# Patient Record
Sex: Male | Born: 1975
Health system: Southern US, Community
[De-identification: ages and names within clinical notes are randomized; demographics above are authoritative.]

---

## 1998-03-14 ENCOUNTER — Ambulatory Visit (HOSPITAL_COMMUNITY): Admission: RE | Admit: 1998-03-14 | Discharge: 1998-03-14 | Payer: Self-pay | Admitting: Family Medicine

## 1998-03-15 ENCOUNTER — Ambulatory Visit (HOSPITAL_COMMUNITY): Admission: RE | Admit: 1998-03-15 | Discharge: 1998-03-15 | Payer: Self-pay | Admitting: Family Medicine

## 2016-12-30 DIAGNOSIS — K580 Irritable bowel syndrome with diarrhea: Secondary | ICD-10-CM | POA: Diagnosis not present

## 2016-12-30 DIAGNOSIS — R7989 Other specified abnormal findings of blood chemistry: Secondary | ICD-10-CM | POA: Diagnosis not present

## 2016-12-30 DIAGNOSIS — Z79899 Other long term (current) drug therapy: Secondary | ICD-10-CM | POA: Diagnosis not present

## 2016-12-30 DIAGNOSIS — Z8042 Family history of malignant neoplasm of prostate: Secondary | ICD-10-CM | POA: Diagnosis not present

## 2017-01-06 DIAGNOSIS — R748 Abnormal levels of other serum enzymes: Secondary | ICD-10-CM | POA: Diagnosis not present

## 2017-05-01 DIAGNOSIS — M25561 Pain in right knee: Secondary | ICD-10-CM | POA: Diagnosis not present

## 2017-06-09 DIAGNOSIS — L814 Other melanin hyperpigmentation: Secondary | ICD-10-CM | POA: Diagnosis not present

## 2017-06-09 DIAGNOSIS — L923 Foreign body granuloma of the skin and subcutaneous tissue: Secondary | ICD-10-CM | POA: Diagnosis not present

## 2017-06-09 DIAGNOSIS — D485 Neoplasm of uncertain behavior of skin: Secondary | ICD-10-CM | POA: Diagnosis not present

## 2017-06-09 DIAGNOSIS — D1801 Hemangioma of skin and subcutaneous tissue: Secondary | ICD-10-CM | POA: Diagnosis not present

## 2017-06-09 DIAGNOSIS — D235 Other benign neoplasm of skin of trunk: Secondary | ICD-10-CM | POA: Diagnosis not present

## 2017-06-09 DIAGNOSIS — D225 Melanocytic nevi of trunk: Secondary | ICD-10-CM | POA: Diagnosis not present

## 2017-10-29 DIAGNOSIS — M222X1 Patellofemoral disorders, right knee: Secondary | ICD-10-CM | POA: Diagnosis not present

## 2017-10-29 DIAGNOSIS — M222X2 Patellofemoral disorders, left knee: Secondary | ICD-10-CM | POA: Diagnosis not present

## 2017-11-08 DIAGNOSIS — M222X2 Patellofemoral disorders, left knee: Secondary | ICD-10-CM | POA: Diagnosis not present

## 2017-11-08 DIAGNOSIS — M222X1 Patellofemoral disorders, right knee: Secondary | ICD-10-CM | POA: Diagnosis not present

## 2018-01-12 DIAGNOSIS — K580 Irritable bowel syndrome with diarrhea: Secondary | ICD-10-CM | POA: Diagnosis not present

## 2018-06-09 DIAGNOSIS — L821 Other seborrheic keratosis: Secondary | ICD-10-CM | POA: Diagnosis not present

## 2018-06-09 DIAGNOSIS — D485 Neoplasm of uncertain behavior of skin: Secondary | ICD-10-CM | POA: Diagnosis not present

## 2018-06-09 DIAGNOSIS — D2372 Other benign neoplasm of skin of left lower limb, including hip: Secondary | ICD-10-CM | POA: Diagnosis not present

## 2018-06-09 DIAGNOSIS — L720 Epidermal cyst: Secondary | ICD-10-CM | POA: Diagnosis not present

## 2018-06-09 DIAGNOSIS — L570 Actinic keratosis: Secondary | ICD-10-CM | POA: Diagnosis not present

## 2018-06-09 DIAGNOSIS — D2261 Melanocytic nevi of right upper limb, including shoulder: Secondary | ICD-10-CM | POA: Diagnosis not present

## 2018-06-30 DIAGNOSIS — H00025 Hordeolum internum left lower eyelid: Secondary | ICD-10-CM | POA: Diagnosis not present

## 2018-08-05 DIAGNOSIS — R490 Dysphonia: Secondary | ICD-10-CM | POA: Diagnosis not present

## 2019-06-11 ENCOUNTER — Emergency Department (HOSPITAL_COMMUNITY): Payer: BC Managed Care – PPO

## 2019-06-11 ENCOUNTER — Other Ambulatory Visit: Payer: Self-pay

## 2019-06-11 ENCOUNTER — Emergency Department (HOSPITAL_COMMUNITY)
Admission: EM | Admit: 2019-06-11 | Discharge: 2019-06-11 | Disposition: A | Discharge status: Home / Self Care | Payer: BC Managed Care – PPO | Attending: Emergency Medicine | Admitting: Emergency Medicine

## 2019-06-11 ENCOUNTER — Encounter (HOSPITAL_COMMUNITY): Payer: Self-pay | Admitting: *Deleted

## 2019-06-11 DIAGNOSIS — R1013 Epigastric pain: Secondary | ICD-10-CM | POA: Insufficient documentation

## 2019-06-11 DIAGNOSIS — R112 Nausea with vomiting, unspecified: Secondary | ICD-10-CM | POA: Insufficient documentation

## 2019-06-11 DIAGNOSIS — K5730 Diverticulosis of large intestine without perforation or abscess without bleeding: Secondary | ICD-10-CM | POA: Diagnosis not present

## 2019-06-11 DIAGNOSIS — R1011 Right upper quadrant pain: Secondary | ICD-10-CM

## 2019-06-11 DIAGNOSIS — K7689 Other specified diseases of liver: Secondary | ICD-10-CM | POA: Diagnosis not present

## 2019-06-11 LAB — LIPASE, BLOOD: Lipase: 31 U/L (ref 11–51)

## 2019-06-11 LAB — URINALYSIS, ROUTINE W REFLEX MICROSCOPIC
Bacteria, UA: NONE SEEN
Bilirubin Urine: NEGATIVE
Glucose, UA: NEGATIVE mg/dL
Hgb urine dipstick: NEGATIVE
Ketones, ur: NEGATIVE mg/dL
Leukocytes,Ua: NEGATIVE
Nitrite: NEGATIVE
Protein, ur: 30 mg/dL — AB
Specific Gravity, Urine: 1.027 (ref 1.005–1.030)
pH: 7 (ref 5.0–8.0)

## 2019-06-11 LAB — CBC
HCT: 43.7 % (ref 39.0–52.0)
Hemoglobin: 14.7 g/dL (ref 13.0–17.0)
MCH: 29.7 pg (ref 26.0–34.0)
MCHC: 33.6 g/dL (ref 30.0–36.0)
MCV: 88.3 fL (ref 80.0–100.0)
Platelets: 163 10*3/uL (ref 150–400)
RBC: 4.95 MIL/uL (ref 4.22–5.81)
RDW: 12.2 % (ref 11.5–15.5)
WBC: 7.3 10*3/uL (ref 4.0–10.5)
nRBC: 0 % (ref 0.0–0.2)

## 2019-06-11 LAB — COMPREHENSIVE METABOLIC PANEL
ALT: 137 U/L — ABNORMAL HIGH (ref 0–44)
AST: 153 U/L — ABNORMAL HIGH (ref 15–41)
Albumin: 4.2 g/dL (ref 3.5–5.0)
Alkaline Phosphatase: 94 U/L (ref 38–126)
Anion gap: 10 (ref 5–15)
BUN: 12 mg/dL (ref 6–20)
CO2: 29 mmol/L (ref 22–32)
Calcium: 9.2 mg/dL (ref 8.9–10.3)
Chloride: 103 mmol/L (ref 98–111)
Creatinine, Ser: 1 mg/dL (ref 0.61–1.24)
GFR calc Af Amer: >60 mL/min (ref >60)
GFR calc non Af Amer: >60 mL/min (ref >60)
Glucose, Bld: 162 mg/dL — ABNORMAL HIGH (ref 70–99)
Potassium: 3.8 mmol/L (ref 3.5–5.1)
Sodium: 142 mmol/L (ref 135–145)
Total Bilirubin: 1.1 mg/dL (ref 0.3–1.2)
Total Protein: 6.5 g/dL (ref 6.5–8.1)

## 2019-06-11 MED ORDER — ONDANSETRON 4 MG PO TBDP: 4.0000 mg | ORAL_TABLET | Freq: Three times a day (TID) | ORAL | 0 refills | Status: AC | PRN

## 2019-06-11 MED ORDER — MORPHINE SULFATE (PF) 4 MG/ML IV SOLN
4.0000 mg | Freq: Once | INTRAVENOUS | Status: AC
  Administered 2019-06-11: 4 mg via INTRAVENOUS
  Filled 2019-06-11: qty 1

## 2019-06-11 MED ORDER — IOHEXOL 300 MG/ML  SOLN
100.0000 mL | Freq: Once | INTRAMUSCULAR | Status: AC | PRN
  Administered 2019-06-11: 100 mL via INTRAVENOUS

## 2019-06-11 MED ORDER — ONDANSETRON HCL 4 MG/2ML IJ SOLN
4.0000 mg | Freq: Once | INTRAMUSCULAR | Status: AC
  Administered 2019-06-11: 06:00:00 4 mg via INTRAVENOUS
  Filled 2019-06-11: qty 2

## 2019-06-11 MED ORDER — SODIUM CHLORIDE 0.9% FLUSH
3.0000 mL | Freq: Once | INTRAVENOUS | Status: AC
  Administered 2019-06-11: 3 mL via INTRAVENOUS

## 2019-06-11 MED ORDER — SODIUM CHLORIDE 0.9 % IV BOLUS
1000.0000 mL | Freq: Once | INTRAVENOUS | Status: AC
  Administered 2019-06-11: 1000 mL via INTRAVENOUS

## 2019-06-11 NOTE — ED Provider Notes (Signed)
[facility] EMERGENCY DEPARTMENT Provider Note   CSN: [id number] Arrival date & time: 06/11/19  0000     History   Chief Complaint Chief Complaint  Patient presents with  . Abdominal Cramping    HPI Jonathan Norris is a 43 y.o. male who is previously healthy who presents with abdominal pain that began after eating hotdogs tonight.  He has had associated nausea and vomiting.  He describes his pain as a combination of sharp, cramping, dull.  He reports this started in his epigastrium and has spread everywhere.  He denies any fevers, diarrhea, bloody stools, urinary symptoms.  Patient reports he has back pain most the time and is unsure if he is having any back pain related to this.  Patient notes that his brother recently had his gallbladder out and his father has also had his gallbladder out.     HPI  History reviewed. No pertinent past medical history.  There are no active problems to display for this patient.   History reviewed. No pertinent surgical history.      Home Medications    Prior to Admission medications   Not on File    Family History No family history on file.  Social History Social History   Tobacco Use  . Smoking status: Never Smoker  . Smokeless tobacco: Never Used  Substance Use Topics  . Alcohol use: Never    Frequency: Never  . Drug use: Not on file     Allergies   Patient has no known allergies.   Review of Systems Review of Systems  Constitutional: Negative for chills and fever.  HENT: Negative for facial swelling and sore throat.   Respiratory: Negative for shortness of breath.   Cardiovascular: Negative for chest pain.  Gastrointestinal: Positive for abdominal pain, nausea and vomiting. Negative for blood in stool and diarrhea.  Genitourinary: Negative for dysuria.  Musculoskeletal: Negative for back pain.  Skin: Negative for rash and wound.  Neurological: Negative for headaches.  Psychiatric/Behavioral:  The patient is not nervous/anxious.      Physical Exam Updated Vital Signs BP 137/84 (BP Location: Right Arm)   Pulse 86   Temp 98 F (36.7 C) (Oral)   Resp 18   Ht 6\' 2"  (1.88 m)   Wt 86.2 kg   SpO2 96%   BMI 24.39 kg/m   Physical Exam Vitals signs and nursing note reviewed.  Constitutional:      General: He is not in acute distress.    Appearance: He is well-developed. He is not diaphoretic.  HENT:     Head: Normocephalic and atraumatic.     Mouth/Throat:     Pharynx: No oropharyngeal exudate.  Eyes:     General: No scleral icterus.       Right eye: No discharge.        Left eye: No discharge.     Conjunctiva/sclera: Conjunctivae normal.     Pupils: Pupils are equal, round, and reactive to light.  Neck:     Musculoskeletal: Normal range of motion and neck supple.     Thyroid: No thyromegaly.  Cardiovascular:     Rate and Rhythm: Normal rate and regular rhythm.     Heart sounds: Normal heart sounds. No murmur. No friction rub. No gallop.   Pulmonary:     Effort: Pulmonary effort is normal. No respiratory distress.     Breath sounds: Normal breath sounds. No stridor. No wheezing or rales.  Abdominal:  General: Bowel sounds are normal. There is no distension.     Palpations: Abdomen is soft.     Tenderness: There is generalized abdominal tenderness and tenderness in the right upper quadrant, epigastric area, left upper quadrant and left lower quadrant. There is no right CVA tenderness, left CVA tenderness, guarding or rebound. Positive signs include Murphy's sign. Negative signs include McBurney's sign.     Comments: Splinting  Lymphadenopathy:     Cervical: No cervical adenopathy.  Skin:    General: Skin is warm and dry.     Coloration: Skin is not pale.     Findings: No rash.  Neurological:     Mental Status: He is alert.     Coordination: Coordination normal.      ED Treatments / Results  Labs (all labs ordered are listed, but only abnormal results  are displayed) Labs Reviewed  COMPREHENSIVE METABOLIC PANEL - Abnormal; Notable for the following components:      Result Value   Glucose, Bld 162 (*)    AST 153 (*)    ALT 137 (*)    All other components within normal limits  URINALYSIS, ROUTINE W REFLEX MICROSCOPIC - Abnormal; Notable for the following components:   Protein, ur 30 (*)    All other components within normal limits  LIPASE, BLOOD  CBC    EKG None  Radiology No results found.  Procedures Procedures (including critical care time)  Medications Ordered in ED Medications  sodium chloride flush (NS) 0.9 % injection 3 mL (has no administration in time range)  sodium chloride 0.9 % bolus 1,000 mL (1,000 mLs Intravenous New Bag/Given 06/11/19 0620)  ondansetron (ZOFRAN) injection 4 mg (4 mg Intravenous Given 06/11/19 0621)  morphine 4 MG/ML injection 4 mg (4 mg Intravenous Given 06/11/19 5681)     Initial Impression / Assessment and Plan / ED Course  I have reviewed the triage vital signs and the nursing notes.  Pertinent labs & imaging results that were available during my care of the patient were reviewed by me and considered in my medical decision making (see chart for details).         Patient presenting with epigastric, right upper quadrant, and left lower quadrant pain.  LFTs are elevated.  No leukocytosis.  Will start with CT abdomen pelvis considering more generalized pain.  Pain controlled with morphine.  Will start IV fluids. At shift change, patient care transferred to Ferne Reus, PA-C for continued evaluation, follow up of CTAP and determination of disposition.     Final Clinical Impressions(s) / ED Diagnoses   Final diagnoses:  Abdominal pain, unspecified abdominal location    ED Discharge Orders    None       Frederica Kuster, PA-C 06/11/19 [place], [place], DO 06/11/19 [phone]

## 2019-06-11 NOTE — ED Provider Notes (Signed)
Epigastric pain after eating hot dogs, then pain traveled to lower abdomen. Tender in LLQ No fever, +N, V No history of gall stones.   Patient care signed out to me by Jonathan Peaks, Jonathan Norris, pending CT abd/pel given distribution of abdominal pain. LFT elevated, post-prandial sxs. May need RUQ [place] (elevated LFT's) if CT negative.   CT results are negative for acute finding. [place] ordered. Patient's pain still well controlled. Tender RUQ>epigastrium. VSS.   US shows gall stone vs polyp. No cholecystitis. He can be discharged home. Will provide referral to surgery for elective follow up.   Jonathan Lange, Jonathan Norris 06/11/19 1029    Jonathan Essex, MD 06/11/19 1654

## 2019-06-11 NOTE — ED Triage Notes (Signed)
The pt is c/o abd with n and vomiting  No diarrhea  His pain started after he ate hot dogs earlier tonight the pain is increasing

## 2019-06-11 NOTE — Discharge Instructions (Signed)
Follow up with [facility] electively for consideration of gall bladder removal.   If you develop a fever, severe pain, uncontrolled vomiting or new concern, please return to the emergency department immediately.

## 2019-06-12 ENCOUNTER — Other Ambulatory Visit: Payer: Self-pay

## 2019-06-12 ENCOUNTER — Emergency Department (HOSPITAL_COMMUNITY)
Admission: EM | Admit: 2019-06-12 | Discharge: 2019-06-12 | Disposition: A | Discharge status: Home / Self Care | Payer: BC Managed Care – PPO | Attending: Emergency Medicine | Admitting: Emergency Medicine

## 2019-06-12 DIAGNOSIS — R1013 Epigastric pain: Secondary | ICD-10-CM | POA: Diagnosis not present

## 2019-06-12 DIAGNOSIS — K8050 Calculus of bile duct without cholangitis or cholecystitis without obstruction: Secondary | ICD-10-CM | POA: Diagnosis not present

## 2019-06-12 LAB — COMPREHENSIVE METABOLIC PANEL
ALT: 98 U/L — ABNORMAL HIGH (ref 0–44)
AST: 45 U/L — ABNORMAL HIGH (ref 15–41)
Albumin: 4.2 g/dL (ref 3.5–5.0)
Alkaline Phosphatase: 88 U/L (ref 38–126)
Anion gap: 12 (ref 5–15)
BUN: 10 mg/dL (ref 6–20)
CO2: 28 mmol/L (ref 22–32)
Calcium: 9.4 mg/dL (ref 8.9–10.3)
Chloride: 103 mmol/L (ref 98–111)
Creatinine, Ser: 1.08 mg/dL (ref 0.61–1.24)
GFR calc Af Amer: >60 mL/min (ref >60)
GFR calc non Af Amer: >60 mL/min (ref >60)
Glucose, Bld: 107 mg/dL — ABNORMAL HIGH (ref 70–99)
Potassium: 4 mmol/L (ref 3.5–5.1)
Sodium: 143 mmol/L (ref 135–145)
Total Bilirubin: 2.3 mg/dL — ABNORMAL HIGH (ref 0.3–1.2)
Total Protein: 6.8 g/dL (ref 6.5–8.1)

## 2019-06-12 LAB — CBC
HCT: 45.1 % (ref 39.0–52.0)
Hemoglobin: 15.4 g/dL (ref 13.0–17.0)
MCH: 30 pg (ref 26.0–34.0)
MCHC: 34.1 g/dL (ref 30.0–36.0)
MCV: 87.9 fL (ref 80.0–100.0)
Platelets: 156 10*3/uL (ref 150–400)
RBC: 5.13 MIL/uL (ref 4.22–5.81)
RDW: 12.2 % (ref 11.5–15.5)
WBC: 6.4 10*3/uL (ref 4.0–10.5)
nRBC: 0 % (ref 0.0–0.2)

## 2019-06-12 LAB — LIPASE, BLOOD: Lipase: 37 U/L (ref 11–51)

## 2019-06-12 MED ORDER — HYDROCODONE-ACETAMINOPHEN 5-325 MG PO TABS: 1.0000 | ORAL_TABLET | Freq: Four times a day (QID) | ORAL | 0 refills | Status: AC | PRN

## 2019-06-12 NOTE — ED Triage Notes (Signed)
Pt endorses gallbladder attack, was d/c home yesterday after being diagnosed with a gallstone. Told to come back if it got worse again. VSS.

## 2019-06-12 NOTE — ED Provider Notes (Signed)
[facility] EMERGENCY DEPARTMENT Provider Note   CSN: [id number] Arrival date & time: 06/12/19  2355     History   Chief Complaint Chief Complaint  Patient presents with   Abdominal Pain    HPI Jonathan Norris is a 43 y.o. male.     Patient seen yesterday.  Patient had CT scan and then ultrasound of the abdomen consistent with biliary sludge.  Common bile duct was 5 mm.  Patient with recurrent pain this morning at about 8 with a eating breakfast.  Lasted for 2 hours and now has resolved.  Patient currently asymptomatic.  The pain he had was mostly epigastric area.  Yesterday patient had nausea and vomiting.     No past medical history on file.  There are no active problems to display for this patient.   No past surgical history on file.      Home Medications    Prior to Admission medications   Medication Sig Start Date End Date Taking? Authorizing Provider  ondansetron (ZOFRAN ODT) 4 MG disintegrating tablet Take 1 tablet (4 mg total) by mouth every 8 (eight) hours as needed for nausea or vomiting. 06/11/19   Charlann Lange, PA-C    Family History No family history on file.  Social History Social History   Tobacco Use   Smoking status: Never Smoker   Smokeless tobacco: Never Used  Substance Use Topics   Alcohol use: Never    Frequency: Never   Drug use: Not on file     Allergies   Patient has no known allergies.   Review of Systems Review of Systems  Constitutional: Negative for chills and fever.  HENT: Negative for congestion, rhinorrhea and sore throat.   Eyes: Negative for visual disturbance.  Respiratory: Negative for cough and shortness of breath.   Cardiovascular: Negative for chest pain and leg swelling.  Gastrointestinal: Positive for abdominal pain. Negative for diarrhea, nausea and vomiting.  Genitourinary: Negative for dysuria.  Musculoskeletal: Negative for back pain and neck pain.  Skin: Negative for rash.    Neurological: Negative for dizziness, light-headedness and headaches.  Hematological: Does not bruise/bleed easily.  Psychiatric/Behavioral: Negative for confusion.     Physical Exam Updated Vital Signs BP (!) 156/95 (BP Location: Right Arm)    Pulse 85    Temp 97.8 F (36.6 C) (Oral)    Resp 20    SpO2 99%   Physical Exam Vitals signs and nursing note reviewed.  Constitutional:      Appearance: Normal appearance. He is well-developed.  HENT:     Head: Normocephalic and atraumatic.  Eyes:     Conjunctiva/sclera: Conjunctivae normal.  Neck:     Musculoskeletal: Normal range of motion and neck supple.  Cardiovascular:     Rate and Rhythm: Normal rate and regular rhythm.     Heart sounds: No murmur.  Pulmonary:     Effort: Pulmonary effort is normal. No respiratory distress.     Breath sounds: Normal breath sounds.  Abdominal:     General: There is no distension.     Palpations: Abdomen is soft. There is no mass.     Tenderness: There is no abdominal tenderness. There is no guarding.  Skin:    General: Skin is warm and dry.     Capillary Refill: Capillary refill takes less than 2 seconds.  Neurological:     General: No focal deficit present.     Mental Status: He is alert and oriented to person,  place, and time.      ED Treatments / Results  Labs (all labs ordered are listed, but only abnormal results are displayed) Labs Reviewed  COMPREHENSIVE METABOLIC PANEL - Abnormal; Notable for the following components:      Result Value   Glucose, Bld 107 (*)    AST 45 (*)    ALT 98 (*)    Total Bilirubin 2.3 (*)    All other components within normal limits  LIPASE, BLOOD  CBC    EKG None  Radiology Ct Abdomen Pelvis W Contrast  Result Date: 06/11/2019 CLINICAL DATA:  Diffuse abdominal pain, worse in the right upper quadrant, epigastric region and left lower quadrant. Nausea and vomiting. Symptoms began after eating hot dog yesterday. EXAM: CT ABDOMEN AND PELVIS  WITH CONTRAST TECHNIQUE: Multidetector CT imaging of the abdomen and pelvis was performed using the standard protocol following bolus administration of intravenous contrast. CONTRAST:  175m OMNIPAQUE IOHEXOL 300 MG/ML  SOLN COMPARISON:  None. FINDINGS: Lower chest: Unremarkable. Hepatobiliary: No focal liver abnormality is seen. No gallstones, gallbladder wall thickening, or biliary dilatation. Pancreas: Unremarkable. No pancreatic ductal dilatation or surrounding inflammatory changes. Spleen: Normal in size without focal abnormality. Adrenals/Urinary Tract: Oval dense calcification at the inferior aspect of the left adrenal gland, possibly related to previous hemorrhage. Otherwise, the adrenal glands have normal appearances. Several small right renal probable cysts. Normal appearing left kidney, ureters and urinary bladder. Stomach/Bowel: Single small sigmoid colon diverticula without evidence of diverticulitis. Otherwise, normal appearing colon, appendix, small bowel and stomach. Vascular/Lymphatic: No significant vascular findings are present. No enlarged abdominal or pelvic lymph nodes. Reproductive: Prostate is unremarkable. Other: Tiny umbilical hernia containing fat. Musculoskeletal: Small left iliac bone island. Mild lumbar and lower thoracic spine degenerative changes. IMPRESSION: No acute abnormality. Electronically Signed   By: SClaudie ReveringM.D.   On: 06/11/2019 08:12   [place]dom[place] Limited  Result Date: 06/11/2019 CLINICAL DATA:  Right upper quadrant pain EXAM: ULTRASOUND ABDOMEN LIMITED RIGHT UPPER QUADRANT COMPARISON:  CT of the abdomen and pelvis dated 06/11/2019 FINDINGS: Gallbladder: 5 x 3 x 3 mm area slightly increased echogenicity without definitive posterior acoustic shadowing in the dependent gallbladder. No signs of wall thickening or reported sonographic Murphy's sign. Common bile duct: Diameter: 5 mm Liver: No focal lesion identified. Within normal limits in parenchymal echogenicity.  Portal vein is patent on color Doppler imaging with normal direction of blood flow towards the liver. Other: None. IMPRESSION: Small stone or sludge in the dependent gallbladder. Given non mobile appearance small polyp is included in the differential. No ultrasound signs of acute cholecystitis. Common bile duct at upper limits of normal. Electronically Signed   By: GZetta BillsM.D.   On: 06/11/2019 10:16    Procedures Procedures (including critical care time)  Medications Ordered in ED Medications - No data to display   Initial Impression / Assessment and Plan / ED Course  I have reviewed the triage vital signs and the nursing notes.  Pertinent labs & imaging results that were available during my care of the patient were reviewed by me and considered in my medical decision making (see chart for details).       Patient with ultrasound done yesterday highly suggestive of gallbladder sludge.  Symptoms probably related to cholelithiasis.  Patient recurrent pain this morning that lasted for 2 hours now completely resolved abdomen nontender.  Labs were already drawn we will compare labs to yesterday.  [facility]  set up elective [facility]bladder removal.  Patient currently nontoxic no acute distress.  Labs without significant abnormalities.  No leukocytosis.  Lipase is normal.  Today bilirubin is elevated at 2.2.  Alk phos is normal.  Patient completely nontender now no further symptoms.  Patient stable for discharge home and follow-up with general surgery.  Final Clinical Impressions(s) / ED Diagnoses   Final diagnoses:  Biliary colic    ED Discharge Orders    None       Fredia Sorrow, MD 06/12/19 1121

## 2019-06-12 NOTE — Discharge Instructions (Signed)
Stick with a bland diet as best as you can.  Return for any new or worse symptoms.  Return for fevers persistent vomiting abdominal pain in the upper part of the abdomen and that the last 3 hours or longer.  Take the pain medicine as needed.  Follow-up with [facility] surgery.

## 2019-06-13 ENCOUNTER — Ambulatory Visit: Payer: Self-pay | Admitting: Surgery

## 2019-06-13 DIAGNOSIS — K580 Irritable bowel syndrome with diarrhea: Secondary | ICD-10-CM | POA: Diagnosis not present

## 2019-06-13 DIAGNOSIS — K8010 Calculus of gallbladder with chronic cholecystitis without obstruction: Secondary | ICD-10-CM | POA: Diagnosis not present

## 2019-06-13 NOTE — H&P (Signed)
Jonathan Norris Documented: 06/13/2019 9:14 AM Location: [place] [facility] Patient #: [id number] DOB: January 14, 1976 Married / Language: [place] / Race: White Male  History of Present Illness Ardeth Sportsman(Suzan Manon C. Maralyn Witherell MD; 06/13/2019 9:48 AM) The patient is a 43 year old male who presents for evaluation of gall stones. Note for "Gall stones": ` ` ` Patient sent for surgical consultation at the request of Dutch QuintScott Zakowski, MD, [facility]  Chief Complaint: abdominal pain. Probable biliary colic ` ` The patient is a pleasant healthy active male. He had an episode of severe epigastric pain after eating 2 hot dogs last weekend. He tried Tums and over-the-counter medicines without help. Felt nauseated and threw up. Would not relent. Went to the emergency room. Hopefully had morphine to help calm things down. Pain was not focal coronary emergent department. Patient notes the pain was primarily epigastric. CT scan was done which was rather underwhelming. Ultrasound done which showed something in the gallbladder wall most likely a stone with some sludge. Information need to consider outpatient surgical follow-up since his pain subsided after 1 dose of morphine. He then had pain the following day after having some eggs and on but her to toast. Pain was even more intense. Went back to the emergency room. Bilirubin 2.2. Rest of his labs are normal. Pain faded away after a few hours. No evidence of cholecystitis. Surgical follow-up recommended. He comes in today, 2 days later.  Patient denies any issues with heartburn or reflux or any other abdominal issues. He does have some occasional loose bowel movements. He's [facility]. He recalls getting a colonoscopy that was underwhelming. Records are not available. He shocks at up to a sensitive stomach. Initially wondered if this was food poisoning but known else was sick. And he had a second attack which made him  more concerned. His urine has been a little more darker yellow but no black urine. No Orange or jaundice, wearing. He is otherwise rather physically active. He rarely drinks alcohol and has not had any past several weeks. He does not smoke. He is not diabetic.  No personal nor family history of GI/colon cancer, inflammatory bowel disease, irritable bowel syndrome, allergy such as Celiac Sprue, dietary/dairy problems, colitis, ulcers nor gastritis. No recent sick contacts/gastroenteritis. No travel outside the country. No changes in diet. No dysphagia to solids or liquids. No significant heartburn or reflux. No hematochezia, hematemesis, coffee ground emesis. No evidence of prior gastric/peptic ulceration.  (Review of systems as stated in this history (HPI) or in the review of systems. Otherwise all other 12 point ROS are negative) ` ` `   Past Surgical History Ethlyn Gallery(Alisha Spillers, CMA; 06/13/2019 9:15 AM) Oral Surgery  Diagnostic Studies History Elease Hashimoto(Alisha Spillers, CMA; 06/13/2019 9:15 AM) Colonoscopy 1-5 years ago  Allergies Elease Hashimoto(Alisha Spillers, CMA; 06/13/2019 9:16 AM) No Known Drug Allergies [06/13/2019]:  Medication History (Alisha Spillers, CMA; 06/13/2019 9:16 AM) No Current Medications Medications Reconciled  Social History Ethlyn Gallery(Alisha Spillers, CMA; 06/13/2019 9:15 AM) Alcohol use Occasional alcohol use. Caffeine use Carbonated beverages, Coffee. No drug use Tobacco use Never smoker.  Family History Ethlyn Gallery(Alisha Spillers, CMA; 06/13/2019 9:15 AM) Diabetes Mellitus Father. Heart Disease Father.  Other Problems Ethlyn Gallery(Alisha Spillers, CMA; 06/13/2019 9:15 AM) Back Pain     Review of Systems (Alisha Spillers CMA; 06/13/2019 9:15 AM) General Not Present- Appetite Loss, Chills, Fatigue, Fever, Night Sweats, Weight Gain and Weight Loss. Skin Not Present- Change in Wart/Mole, Dryness, Hives, Jaundice, New Lesions, Non-Healing Wounds, Rash  and Ulcer. HEENT Not Present-  Earache, Hearing Loss, Hoarseness, Nose Bleed, Oral Ulcers, Ringing in the Ears, Seasonal Allergies, Sinus Pain, Sore Throat, Visual Disturbances, Wears glasses/contact lenses and Yellow Eyes. Respiratory Not Present- Bloody sputum, Chronic Cough, Difficulty Breathing, Snoring and Wheezing. Breast Not Present- Breast Mass, Breast Pain, Nipple Discharge and Skin Changes. Cardiovascular Not Present- Chest Pain, Difficulty Breathing Lying Down, Leg Cramps, Palpitations, Rapid Heart Rate, Shortness of Breath and Swelling of Extremities. Gastrointestinal Present- Abdominal Pain, Nausea and Vomiting. Not Present- Bloating, Bloody Stool, Change in Bowel Habits, Chronic diarrhea, Constipation, Difficulty Swallowing, Excessive gas, Gets full quickly at meals, Hemorrhoids, Indigestion and Rectal Pain. Male Genitourinary Not Present- Blood in Urine, Change in Urinary Stream, Frequency, Impotence, Nocturia, Painful Urination, Urgency and Urine Leakage. Musculoskeletal Not Present- Back Pain, Joint Pain, Joint Stiffness, Muscle Pain, Muscle Weakness and Swelling of Extremities. Neurological Not Present- Decreased Memory, Fainting, Headaches, Numbness, Seizures, Tingling, Tremor, Trouble walking and Weakness. Psychiatric Not Present- Anxiety, Bipolar, Change in Sleep Pattern, Depression, Fearful and Frequent crying. Endocrine Not Present- Cold Intolerance, Excessive Hunger, Hair Changes, Heat Intolerance, Hot flashes and New Diabetes. Hematology Not Present- Blood Thinners, Easy Bruising, Excessive bleeding, Gland problems, HIV and Persistent Infections.  Vitals (Alisha Spillers CMA; 06/13/2019 9:16 AM) 06/13/2019 9:15 AM Weight: 186 lb Height: 74in Body Surface Area: 2.11 m Body Mass Index: 23.88 kg/m  Temp.: 98.86F(Oral)  Pulse: 86 (Regular)  BP: 110/74 (Sitting, Left Arm, Standard)        Physical Exam Adin Hector MD; 06/13/2019 9:43 AM)  General Mental Status-Alert. General  Appearance-Not in acute distress, Not Sickly. Orientation-Oriented X3. Hydration-Well hydrated. Voice-Normal.  Integumentary Global Assessment Upon inspection and palpation of skin surfaces of the - Axillae: non-tender, no inflammation or ulceration, no drainage. and Distribution of scalp and body hair is normal. General Characteristics Temperature - normal warmth is noted.  Head and Neck Head-normocephalic, atraumatic with no lesions or palpable masses. Face Global Assessment - atraumatic, no absence of expression. Neck Global Assessment - no abnormal movements, no bruit auscultated on the right, no bruit auscultated on the left, no decreased range of motion, non-tender. Trachea-midline. Thyroid Gland Characteristics - non-tender.  Eye Eyeball - Left-Extraocular movements intact, No Nystagmus - Left. Eyeball - Right-Extraocular movements intact, No Nystagmus - Right. Cornea - Left-No Hazy - Left. Cornea - Right-No Hazy - Right. Sclera/Conjunctiva - Left-No scleral icterus, No Discharge - Left. Sclera/Conjunctiva - Right-No scleral icterus, No Discharge - Right. Pupil - Left-Direct reaction to light normal. Pupil - Right-Direct reaction to light normal.  ENMT Ears Pinna - Left - no drainage observed, no generalized tenderness observed. Pinna - Right - no drainage observed, no generalized tenderness observed. Nose and Sinuses External Inspection of the Nose - no destructive lesion observed. Inspection of the nares - Left - quiet respiration. Inspection of the nares - Right - quiet respiration. Mouth and Throat Lips - Upper Lip - no fissures observed, no pallor noted. Lower Lip - no fissures observed, no pallor noted. Nasopharynx - no discharge present. Oral Cavity/Oropharynx - Tongue - no dryness observed. Oral Mucosa - no cyanosis observed. Hypopharynx - no evidence of airway distress observed.  Chest and Lung Exam Inspection Movements - Normal and  Symmetrical. Accessory muscles - No use of accessory muscles in breathing. Palpation Palpation of the chest reveals - Non-tender. Auscultation Breath sounds - Normal and Clear.  Cardiovascular Auscultation Rhythm - Regular. Murmurs & Other Heart Sounds - Auscultation of the heart reveals - No Murmurs and  No Systolic Clicks.  Abdomen Inspection Inspection of the abdomen reveals - No Visible peristalsis and No Abnormal pulsations. Umbilicus - No Bleeding, No Urine drainage. Palpation/Percussion Palpation and Percussion of the abdomen reveal - Soft, Non Tender, No Rebound tenderness, No Rigidity (guarding) and No Cutaneous hyperesthesia. Note: Abdomen soft. mild epigastric and right upper quadrant discomfort. Less so on the left side. Periumbilical and infraumbilical abdomen nontender. Not distended. No umbilical or incisional hernias. No guarding.  Male Genitourinary Sexual Maturity Tanner 5 - Adult hair pattern and Adult penile size and shape.  Peripheral Vascular Upper Extremity Inspection - Left - No Cyanotic nailbeds - Left, Not Ischemic. Inspection - Right - No Cyanotic nailbeds - Right, Not Ischemic.  Neurologic Neurologic evaluation reveals -normal attention span and ability to concentrate, able to name objects and repeat phrases. Appropriate fund of knowledge , normal sensation and normal coordination. Mental Status Affect - not angry, not paranoid. Cranial Nerves-Normal Bilaterally. Gait-Normal.  Neuropsychiatric Mental status exam performed with findings of-able to articulate well with normal speech/language, rate, volume and coherence, thought content normal with ability to perform basic computations and apply abstract reasoning and no evidence of hallucinations, delusions, obsessions or homicidal/suicidal ideation.  Musculoskeletal Global Assessment Spine, Ribs and Pelvis - no instability, subluxation or laxity. Right Upper Extremity - no instability,  subluxation or laxity.  Lymphatic Head & Neck  General Head & Neck Lymphatics: Bilateral - Description - No Localized lymphadenopathy. Axillary  General Axillary Region: Bilateral - Description - No Localized lymphadenopathy. Femoral & Inguinal  Generalized Femoral & Inguinal Lymphatics: Left - Description - No Localized lymphadenopathy. Right - Description - No Localized lymphadenopathy.    Assessment & Plan Ardeth Sportsman MD; 06/13/2019 9:45 AM)  HYPERBILIRUBINEMIA (E80.6) Impression: bilirubin elevated to 2.2 yesterday. He is feeling better now and does not have any obvious jaundice. I'm skeptical of obstructing choledocholithiasis. Certainly we will do cholangiogram. Again would like to try and do this soon and not wait a few months. Patient would like to get surgery as soon as possible should there be evidence of a common bile duct stone, will recheck to his gastrologist, Dr. Dulce Sellar, who could do ERCP.   CHRONIC CHOLECYSTITIS WITH CALCULUS (K80.10) Impression: classic story of biliary colic with differential diagnosis otherwise underwhelming.  I think he would benefit from cholecystectomy. With him going to the emergency room twice in the past week, I don't think this can wait very long. He would like to get it done as soon as possible. We will work to coordinate a convenient time  Current Plans You are being scheduled for surgery- Our schedulers will call you.  You should hear from our office's scheduling department within 5 working days about the location, date, and time of surgery. We try to make accommodations for patient's preferences in scheduling surgery, but sometimes the OR schedule or the surgeon's schedule prevents [place] from making those accommodations.  If you have not heard from our office [phone]) in 5 working days, call the office and ask for your surgeon's nurse.  If you have other questions about your diagnosis, plan, or surgery, call the office and ask  for your surgeon's nurse.  Written instructions provided Pt Education - Pamphlet Given - Laparoscopic Gallbladder Surgery: discussed with patient and provided information. The anatomy & physiology of hepatobiliary & pancreatic function was discussed. The pathophysiology of gallbladder dysfunction was discussed. Natural history risks without surgery was discussed. I feel the risks of no intervention will lead to serious problems that  outweigh the operative risks; therefore, I recommended cholecystectomy to remove the pathology. I explained laparoscopic techniques with possible need for an open approach. Probable cholangiogram to evaluate the bilary tract was explained as well.  Risks such as bleeding, infection, abscess, leak, injury to other organs, need for further treatment, heart attack, death, and other risks were discussed. I noted a good likelihood this will help address the problem. Possibility that this will not correct all abdominal symptoms was explained. Goals of post-operative recovery were discussed as well. We will work to minimize complications. An educational handout further explaining the pathology and treatment options was given as well. Questions were answered. The patient expresses understanding & wishes to proceed with surgery.  Pt Education - [facility] Laparosopic Post Op HCI (Lissie Hinesley) Pt Education - [facility] Good Bowel Health (Aalayah Riles) Pt Education - Laparoscopic Cholecystectomy: gallbladder  IRRITABLE BOWEL SYNDROME WITH DIARRHEA (K58.0) Impression: He claims to have loose bowel movements. Does not seem to be high frequency/volume. He seen [facility] gastroenterology. Dr. Dulce Sellar. No major issues recently.  Current Plans Pt Education - CCS IBS patient info: discussed with patient and provided information. Pt Education - [facility] Good Bowel Health (Barnaby Rippeon)  Ardeth Sportsman, MD, FACS, MASCRS Gastrointestinal and Minimally Invasive Surgery  [facility] [address]  [address] [place], [place] [address] [phone] Main / Paging [phone] Fax

## 2019-06-19 DIAGNOSIS — D2261 Melanocytic nevi of right upper limb, including shoulder: Secondary | ICD-10-CM | POA: Diagnosis not present

## 2019-06-19 DIAGNOSIS — D225 Melanocytic nevi of trunk: Secondary | ICD-10-CM | POA: Diagnosis not present

## 2019-06-19 DIAGNOSIS — L570 Actinic keratosis: Secondary | ICD-10-CM | POA: Diagnosis not present

## 2019-06-19 DIAGNOSIS — L814 Other melanin hyperpigmentation: Secondary | ICD-10-CM | POA: Diagnosis not present

## 2019-07-03 DIAGNOSIS — R7989 Other specified abnormal findings of blood chemistry: Secondary | ICD-10-CM | POA: Diagnosis not present

## 2019-07-03 DIAGNOSIS — K8050 Calculus of bile duct without cholangitis or cholecystitis without obstruction: Secondary | ICD-10-CM | POA: Diagnosis not present

## 2019-07-04 ENCOUNTER — Other Ambulatory Visit: Payer: Self-pay | Admitting: Physician Assistant

## 2019-07-04 DIAGNOSIS — K8050 Calculus of bile duct without cholangitis or cholecystitis without obstruction: Secondary | ICD-10-CM

## 2019-07-06 DIAGNOSIS — R7989 Other specified abnormal findings of blood chemistry: Secondary | ICD-10-CM | POA: Diagnosis not present

## 2019-07-29 ENCOUNTER — Ambulatory Visit
Admission: RE | Admit: 2019-07-29 | Discharge: 2019-07-29 | Disposition: A | Discharge status: Home / Self Care | Payer: BC Managed Care – PPO | Source: Ambulatory Visit | Attending: Physician Assistant | Admitting: Physician Assistant

## 2019-07-29 ENCOUNTER — Other Ambulatory Visit: Payer: Self-pay

## 2019-07-29 DIAGNOSIS — K8020 Calculus of gallbladder without cholecystitis without obstruction: Secondary | ICD-10-CM | POA: Diagnosis not present

## 2019-07-29 DIAGNOSIS — K8050 Calculus of bile duct without cholangitis or cholecystitis without obstruction: Secondary | ICD-10-CM

## 2019-07-29 DIAGNOSIS — R1011 Right upper quadrant pain: Secondary | ICD-10-CM | POA: Diagnosis not present

## 2019-07-29 DIAGNOSIS — R935 Abnormal findings on diagnostic imaging of other abdominal regions, including retroperitoneum: Secondary | ICD-10-CM | POA: Diagnosis not present

## 2019-07-29 MED ORDER — GADOBENATE DIMEGLUMINE 529 MG/ML IV SOLN
17.0000 mL | Freq: Once | INTRAVENOUS | Status: AC | PRN
  Administered 2019-07-29: 17 mL via INTRAVENOUS

## 2019-11-22 DIAGNOSIS — K529 Noninfective gastroenteritis and colitis, unspecified: Secondary | ICD-10-CM | POA: Diagnosis not present

## 2019-11-22 DIAGNOSIS — R1013 Epigastric pain: Secondary | ICD-10-CM | POA: Diagnosis not present

## 2019-11-22 DIAGNOSIS — K580 Irritable bowel syndrome with diarrhea: Secondary | ICD-10-CM | POA: Diagnosis not present

## 2021-02-25 IMAGING — CT CT ABD-PELV W/ CM
2 of 3 series · 14 of 42 positions shown, 18 images · IV contrast (Omni 300)
Comparison: None.

CLINICAL DATA: Diffuse abdominal pain, worse in the right upper
quadrant, epigastric region and left lower quadrant. Nausea and
vomiting. Symptoms began after eating hot dog yesterday.

EXAM:
CT ABDOMEN AND PELVIS WITH CONTRAST
TECHNIQUE: Multidetector CT imaging of the abdomen and pelvis was performed
using the standard protocol following bolus administration of
intravenous contrast.
CONTRAST:  100mL OMNIPAQUE IOHEXOL 300 MG/ML  SOLN

[Series 3: a/p w/ 5mm · axial · 0.74mm/px · z∈[-511,-116]mm · 11 of 91 slices shown, 15 images]
[im 8/91  soft-tissue]
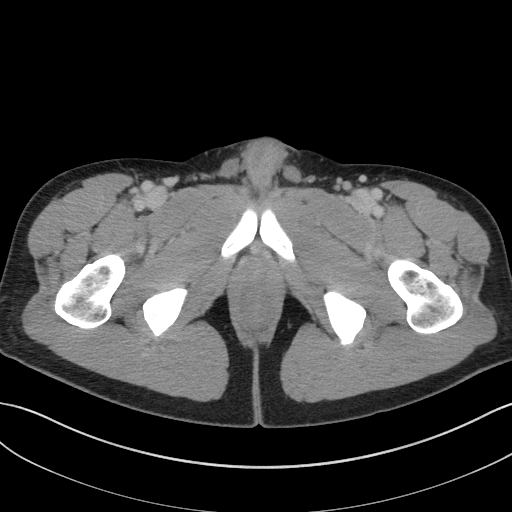
[im 8/91  bone]
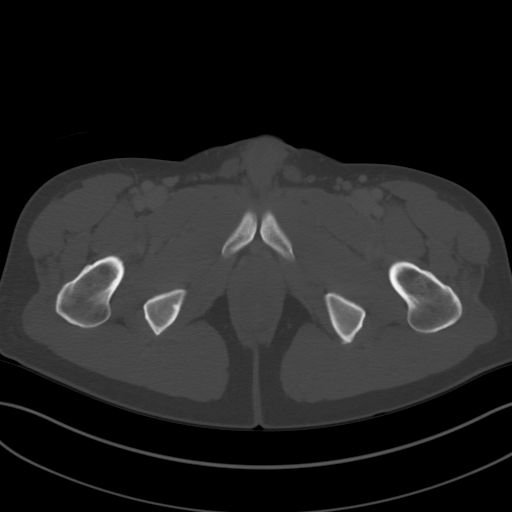
[im 16/91  soft-tissue]
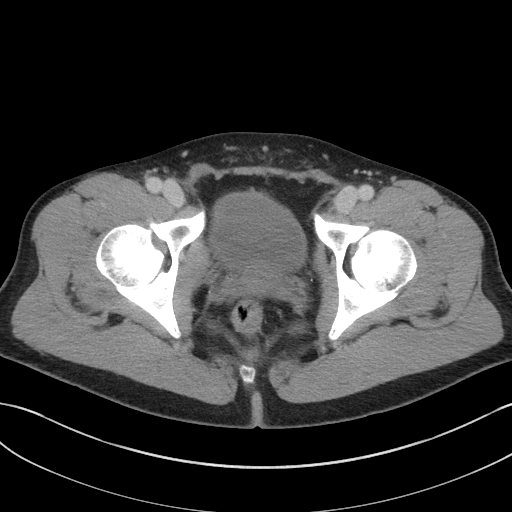
[im 28/91  soft-tissue]
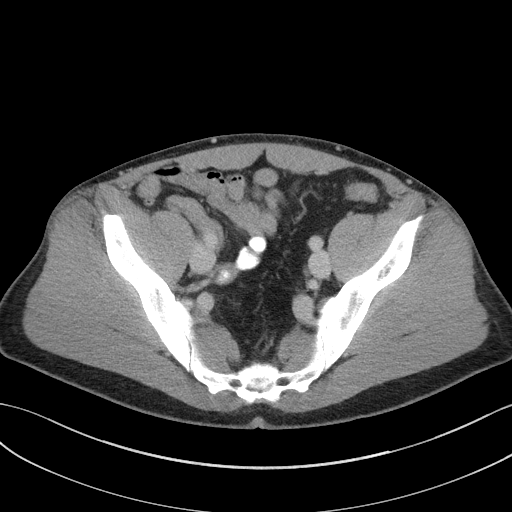
[im 36/91  soft-tissue]
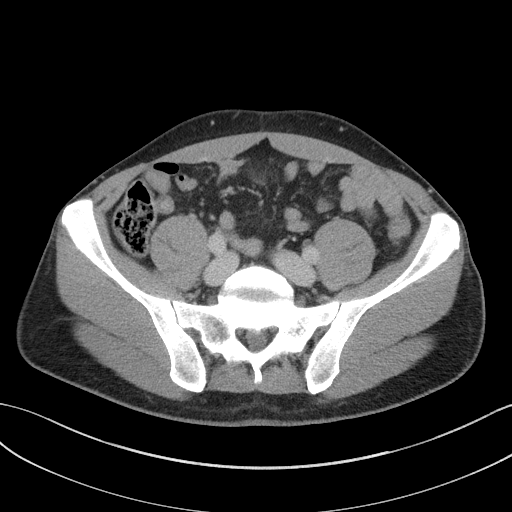
[im 47/91  soft-tissue]
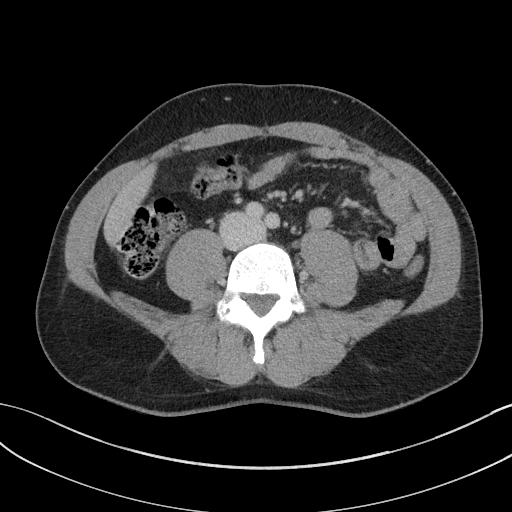
[im 55/91  soft-tissue]
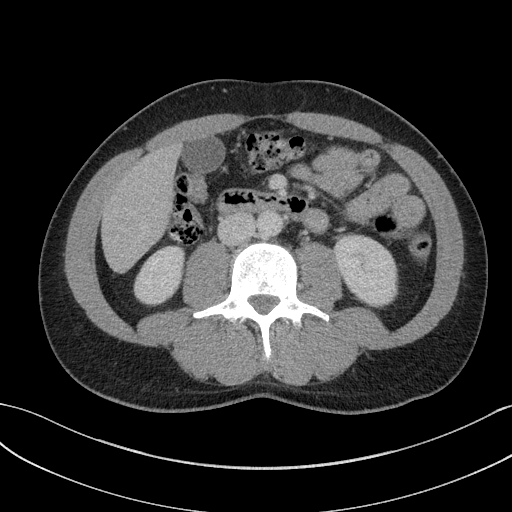
[im 63/91  soft-tissue]
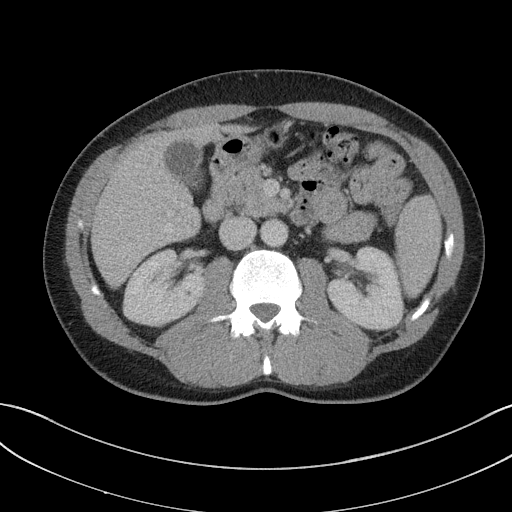
[im 75/91  soft-tissue]
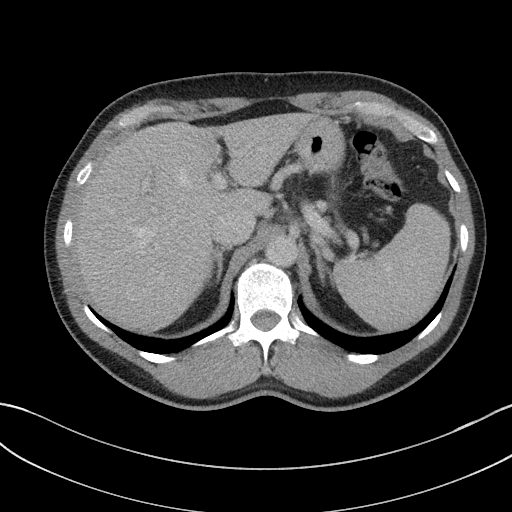
[im 75/91  lung]
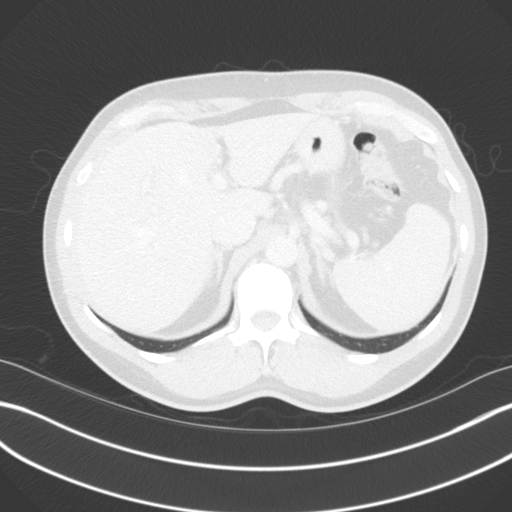
[im 79/91  lung]
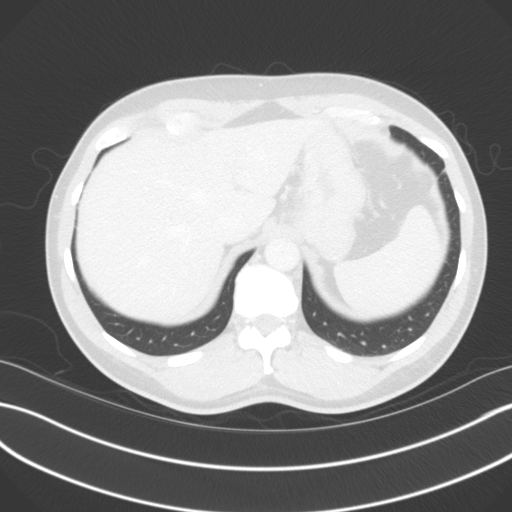
[im 83/91  soft-tissue]
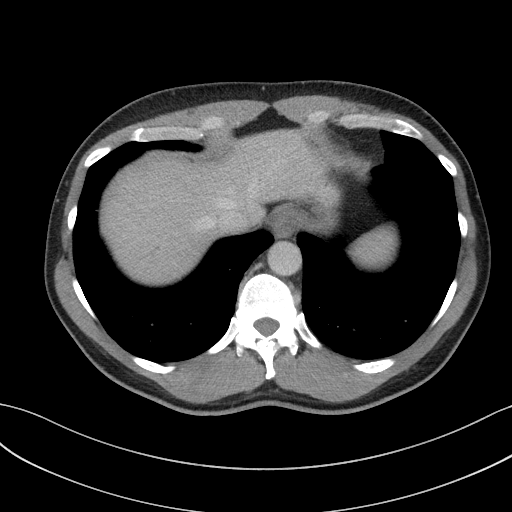
[im 83/91  lung]
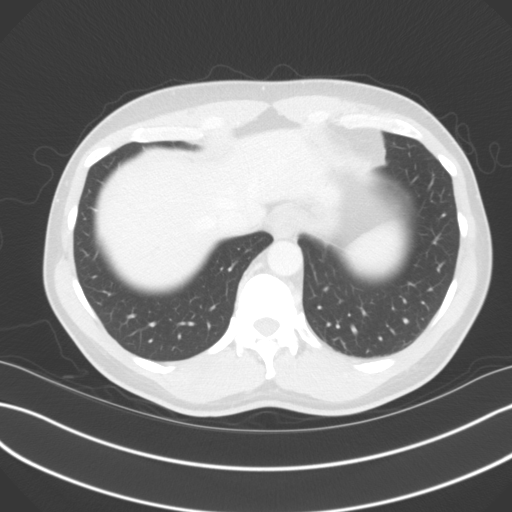
[im 83/91  bone]
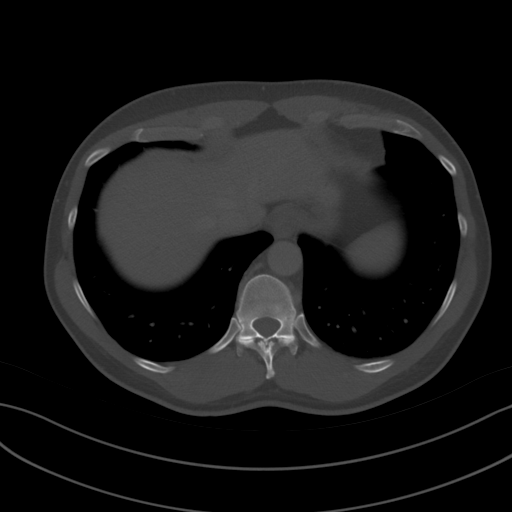
[im 87/91  lung]
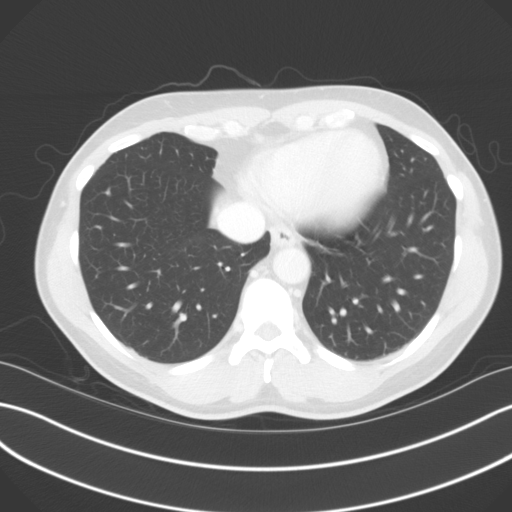

[Series 6: a/p w/ cor · coronal · 0.77mm/px · 3 of 134 slices shown]
[im 45/134  soft-tissue]
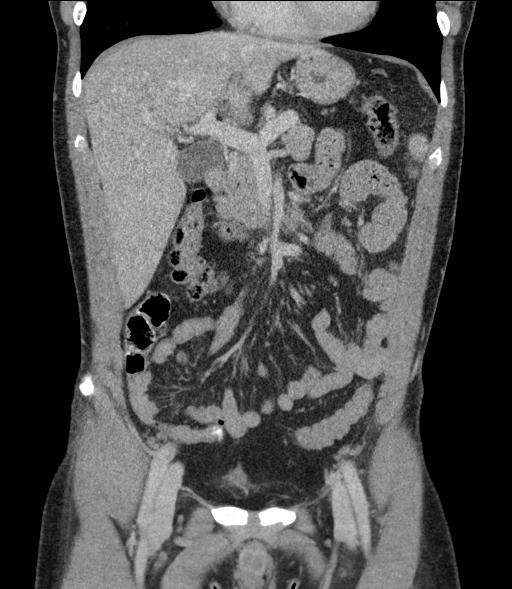
[im 60/134  soft-tissue]
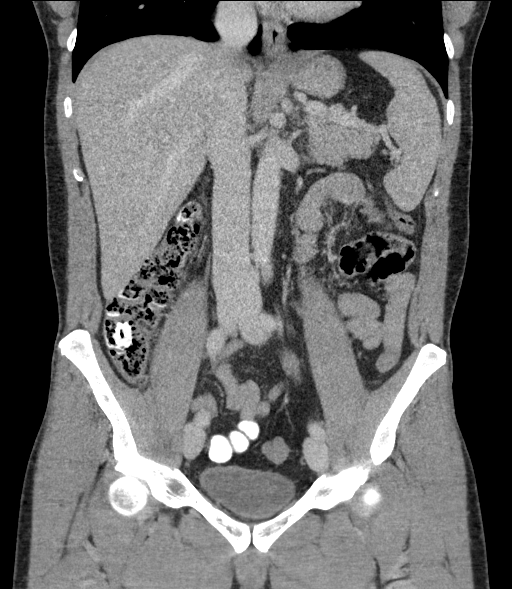
[im 74/134  soft-tissue]
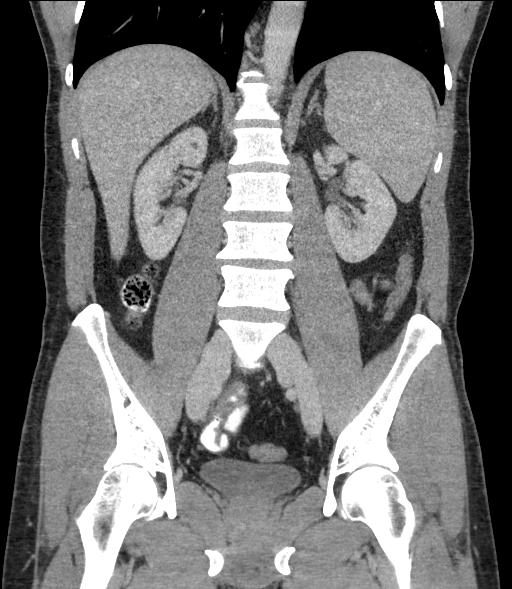

[14 of 42 positions shown; findings below may reference images not displayed]

FINDINGS: Lower chest: Unremarkable.

Hepatobiliary: No focal liver abnormality is seen. No gallstones,
gallbladder wall thickening, or biliary dilatation.

Pancreas: Unremarkable. No pancreatic ductal dilatation or
surrounding inflammatory changes.

Spleen: Normal in size without focal abnormality.

Adrenals/Urinary Tract: Oval dense calcification at the inferior
aspect of the left adrenal gland, possibly related to previous
hemorrhage. Otherwise, the adrenal glands have normal appearances.
Several small right renal probable cysts. Normal appearing left
kidney, ureters and urinary bladder.

Stomach/Bowel: Single small sigmoid colon diverticula without
evidence of diverticulitis. Otherwise, normal appearing colon,
appendix, small bowel and stomach.

Vascular/Lymphatic: No significant vascular findings are present. No
enlarged abdominal or pelvic lymph nodes.

Reproductive: Prostate is unremarkable.

Other: Tiny umbilical hernia containing fat.

Musculoskeletal: Small left iliac bone island. Mild lumbar and lower
thoracic spine degenerative changes.
IMPRESSION: No acute abnormality.

## 2021-04-23 ENCOUNTER — Other Ambulatory Visit: Payer: Self-pay | Admitting: *Deleted

## 2021-04-23 ENCOUNTER — Other Ambulatory Visit: Payer: Self-pay

## 2021-04-23 ENCOUNTER — Other Ambulatory Visit: Payer: Self-pay | Admitting: Urology

## 2021-04-23 DIAGNOSIS — Z125 Encounter for screening for malignant neoplasm of prostate: Secondary | ICD-10-CM

## 2021-04-23 NOTE — Progress Notes (Signed)
Patient: Jonathan Norris           Date of Birth: [birth year]           MRN: [id number] Visit Date: 04/23/2021 PCP: Juluis Rainier, MD  Prostate Cancer Screening Date of last physical exam: 03/26/21 Date of last rectal exam:  (2016) Have you ever had any of the following?: Family member with prostate cancer Have you ever had or been told you have an allergy to latex products?: No Are you currently taking any natural prostate preparations?: No Are you currently experiencing any urinary symptoms?: No  Prostate Exam Exam not completed. PSA Only.  Patient's History There are no problems to display for this patient.  No past medical history on file.  No family history on file.  Social History   Occupational History   Not on file  Tobacco Use   Smoking status: Never   Smokeless tobacco: Never  Substance and Sexual Activity   Alcohol use: Never   Drug use: Not on file   Sexual activity: Not on file

## 2021-04-24 LAB — PSA: Prostate Specific Ag, Serum: 0.3 ng/mL (ref 0.0–4.0)

## 2021-05-02 ENCOUNTER — Telehealth: Payer: Self-pay

## 2021-05-02 NOTE — Telephone Encounter (Signed)
Normal PSA result letter mailed.   May 02, 2021         Dear Mr. Jonathan Norris,   Thank you for your participation in the Prostate Cancer Screening at [facility] on April 23, 2021.   The result of your blood test for the level of the Prostate Specific Antigen (PSA) was found to be normal. It is recommended that continue yearly screening and share these results with your physician.  If you have any questions about these results, please call Fonnie Mu at [phone] or Shawan Tosh at [phone].  Sincerely,     Fonnie Mu, MSN, RN,OCN Oncology Outreach Manager [facility]   Clois Dupes, LPN Oncology Outreach  [facility]

## 2021-12-16 DIAGNOSIS — Z713 Dietary counseling and surveillance: Secondary | ICD-10-CM | POA: Diagnosis not present

## 2022-05-07 DIAGNOSIS — Z13220 Encounter for screening for lipoid disorders: Secondary | ICD-10-CM | POA: Diagnosis not present

## 2022-05-07 DIAGNOSIS — Z0000 Encounter for general adult medical examination without abnormal findings: Secondary | ICD-10-CM | POA: Diagnosis not present

## 2022-09-03 DIAGNOSIS — R3981 Functional urinary incontinence: Secondary | ICD-10-CM | POA: Diagnosis not present

## 2022-09-03 DIAGNOSIS — E785 Hyperlipidemia, unspecified: Secondary | ICD-10-CM | POA: Diagnosis not present

## 2022-12-11 DIAGNOSIS — E785 Hyperlipidemia, unspecified: Secondary | ICD-10-CM | POA: Diagnosis not present

## 2023-09-10 DIAGNOSIS — R0789 Other chest pain: Secondary | ICD-10-CM | POA: Diagnosis not present

## 2023-09-10 DIAGNOSIS — E785 Hyperlipidemia, unspecified: Secondary | ICD-10-CM | POA: Diagnosis not present

## 2023-09-10 DIAGNOSIS — I10 Essential (primary) hypertension: Secondary | ICD-10-CM | POA: Diagnosis not present

## 2024-02-08 DIAGNOSIS — R102 Pelvic and perineal pain: Secondary | ICD-10-CM | POA: Diagnosis not present

## 2024-02-08 DIAGNOSIS — M4726 Other spondylosis with radiculopathy, lumbar region: Secondary | ICD-10-CM | POA: Diagnosis not present

## 2024-02-08 DIAGNOSIS — M4725 Other spondylosis with radiculopathy, thoracolumbar region: Secondary | ICD-10-CM | POA: Diagnosis not present

## 2024-02-08 DIAGNOSIS — M4723 Other spondylosis with radiculopathy, cervicothoracic region: Secondary | ICD-10-CM | POA: Diagnosis not present

## 2024-02-08 DIAGNOSIS — M4728 Other spondylosis with radiculopathy, sacral and sacrococcygeal region: Secondary | ICD-10-CM | POA: Diagnosis not present

## 2024-02-15 DIAGNOSIS — M79644 Pain in right finger(s): Secondary | ICD-10-CM | POA: Diagnosis not present

## 2024-02-15 DIAGNOSIS — S63639A Sprain of interphalangeal joint of unspecified finger, initial encounter: Secondary | ICD-10-CM | POA: Diagnosis not present

## 2024-02-15 DIAGNOSIS — M79641 Pain in right hand: Secondary | ICD-10-CM | POA: Diagnosis not present

## 2024-02-15 DIAGNOSIS — M25641 Stiffness of right hand, not elsewhere classified: Secondary | ICD-10-CM | POA: Diagnosis not present

## 2024-02-22 DIAGNOSIS — M79644 Pain in right finger(s): Secondary | ICD-10-CM | POA: Diagnosis not present

## 2024-02-22 DIAGNOSIS — M25641 Stiffness of right hand, not elsewhere classified: Secondary | ICD-10-CM | POA: Diagnosis not present
